# Patient Record
Sex: Female | Born: 1965 | Race: White | Hispanic: No | Marital: Married
Health system: Southern US, Community
[De-identification: ages and names within clinical notes are randomized; demographics above are authoritative.]

---

## 2016-12-24 ENCOUNTER — Emergency Department (HOSPITAL_COMMUNITY): Payer: BLUE CROSS/BLUE SHIELD

## 2016-12-24 ENCOUNTER — Emergency Department (HOSPITAL_COMMUNITY)
Admission: EM | Admit: 2016-12-24 | Discharge: 2016-12-24 | Disposition: A | Discharge status: Home / Self Care | Payer: BLUE CROSS/BLUE SHIELD | Attending: Emergency Medicine | Admitting: Emergency Medicine

## 2016-12-24 DIAGNOSIS — S8991XA Unspecified injury of right lower leg, initial encounter: Secondary | ICD-10-CM | POA: Diagnosis present

## 2016-12-24 DIAGNOSIS — Y929 Unspecified place or not applicable: Secondary | ICD-10-CM | POA: Diagnosis not present

## 2016-12-24 DIAGNOSIS — W010XXA Fall on same level from slipping, tripping and stumbling without subsequent striking against object, initial encounter: Secondary | ICD-10-CM | POA: Insufficient documentation

## 2016-12-24 DIAGNOSIS — Y999 Unspecified external cause status: Secondary | ICD-10-CM | POA: Diagnosis not present

## 2016-12-24 DIAGNOSIS — S8001XA Contusion of right knee, initial encounter: Secondary | ICD-10-CM | POA: Insufficient documentation

## 2016-12-24 DIAGNOSIS — Y9301 Activity, walking, marching and hiking: Secondary | ICD-10-CM | POA: Insufficient documentation

## 2016-12-24 DIAGNOSIS — W19XXXA Unspecified fall, initial encounter: Secondary | ICD-10-CM

## 2016-12-24 MED ORDER — IBUPROFEN 400 MG PO TABS
600.0000 mg | ORAL_TABLET | Freq: Once | ORAL | Status: AC
  Administered 2016-12-24: 12:00:00 600 mg via ORAL
  Filled 2016-12-24: qty 1

## 2016-12-24 NOTE — ED Provider Notes (Signed)
[facility]-EMERGENCY DEPT Provider Note   CSN: [id number] Arrival date & time: 12/24/16  1011  By signing my name below, I, Freida Busmaniana Omoyeni, attest that this documentation has been prepared under the direction and in the presence of Melburn HakeNicole Dezirea Mccollister, [place]-C. Electronically Signed: Freida Busmaniana Omoyeni, Scribe. 12/24/2016. 11:58 AM.  History   Chief Complaint Chief Complaint  Patient presents with  . Fall  . Hip Pain  . Knee Pain    The history is provided by the patient. No language interpreter was used.     HPI Comments:  Mikayla Dennis is a 51 y.o. obese female who presents to the Emergency Department s/p fall this AM complaining of moderate right knee and left shoulder pain following the incident. Pt slipped on wet ground, her right knee came off of the ground first and she landed on the left side.  She denies head injury and LOC. Pt notes mild associated soreness to the left lateral abdomen and lower back. No h/o knee or hip replacements. No alleviating factors noted. She takes ASA daily but no other anti-coagulants. Denies fever, headache, chest pain, abdominal pain, numbness, tingling, weakness, urinary or bowel incontinence. Denies taking any meds PTA.  No past medical history on file.  There are no active problems to display for this patient.   No past surgical history on file.  OB History    No data available       Home Medications    Prior to Admission medications   Not on File    Family History No family history on file.  Social History Social History  Substance Use Topics  . Smoking status: Not on file  . Smokeless tobacco: Not on file  . Alcohol use Not on file     Allergies   Patient has no known allergies.   Review of Systems Review of Systems  Gastrointestinal: Positive for abdominal pain.  Musculoskeletal: Positive for arthralgias, back pain and myalgias.  Neurological: Negative for syncope and weakness.     Physical Exam Updated Vital Signs BP  124/66   Pulse 83   Temp 97.6 F (36.4 C)   Resp 16   SpO2 95%   Physical Exam  Constitutional: She is oriented to person, place, and time. She appears well-developed and well-nourished.  HENT:  Head: Normocephalic and atraumatic. Head is without raccoon's eyes, without Battle's sign, without abrasion, without contusion and without laceration.  Eyes: Conjunctivae and EOM are normal. Right eye exhibits no discharge. Left eye exhibits no discharge. No scleral icterus.  Neck: Normal range of motion. Neck supple.  Cardiovascular: Normal rate, regular rhythm, normal heart sounds and intact distal pulses.   Pulmonary/Chest: Effort normal and breath sounds normal. No respiratory distress. She has no wheezes. She has no rales. She exhibits no tenderness.  No tenderness or deformity palpated over BL clavicle or AC joint   Abdominal: Soft. Bowel sounds are normal. She exhibits no distension and no mass. There is no tenderness. There is no rebound and no guarding.  Musculoskeletal: She exhibits tenderness. She exhibits no edema.       Left shoulder: She exhibits normal range of motion, no tenderness, no bony tenderness, no swelling, no effusion, no crepitus, no deformity, no laceration, no pain, no spasm, normal pulse and normal strength.  No midline C, T, or L tenderness. Full range of motion of neck and back. Full range of motion of bilateral upper and lower extremities, with 5/5 strength. Sensation intact. 2+ radial and PT pulses. Cap  refill <2 seconds.   Tenderness over rigtht anterior knee with FROM and 5/5 strength No swelling, ecchymosis, abrasion, or laceration  FROM of BL hips, no pelvic instability    Neurological: She is alert and oriented to person, place, and time. She has normal strength. No sensory deficit. Gait normal.  Skin: Skin is warm and dry. Capillary refill takes less than 2 seconds.  Nursing note and vitals reviewed.    ED Treatments / Results  DIAGNOSTIC  STUDIES:  Oxygen Saturation is 95% on RA, adequate by my interpretation.    COORDINATION OF CARE:  11:34 AM Discussed treatment plan with pt at bedside and pt agreed to plan.  Labs (all labs ordered are listed, but only abnormal results are displayed) Labs Reviewed - No data to display  EKG  EKG Interpretation None       Radiology Dg Knee Complete 4 Views Right  Result Date: 12/24/2016 CLINICAL DATA:  Right knee pain after fall EXAM: RIGHT KNEE - COMPLETE 4+ VIEW COMPARISON:  None. FINDINGS: Prepatellar soft tissue swelling. No fracture, joint effusion, dislocation or suspicious focal osseous lesion. Minimal medial and patellofemoral compartment osteoarthritis. No radiopaque foreign body. IMPRESSION: 1. Prepatellar right knee soft tissue swelling, with no fracture, joint effusion or malalignment. 2. Minimal medial and patellofemoral compartment right knee osteoarthritis. Electronically Signed   By: Delbert Phenix M.D.   On: 12/24/2016 11:30   Dg Hip Unilat  With Pelvis 2-3 Views Right  Result Date: 12/24/2016 CLINICAL DATA:  Fall this morning.  Right hip pain. EXAM: DG HIP (WITH OR WITHOUT PELVIS) 2-3V RIGHT COMPARISON:  None. FINDINGS: The mineralization and alignment are normal. There is no evidence of acute fracture or dislocation. No evidence of femoral head avascular necrosis. The hip joint spaces are maintained. There are degenerative changes at the sacroiliac joints bilaterally. Postsurgical changes from previous pelvic hernia repair noted. IMPRESSION: No acute findings. Electronically Signed   By: Carey Bullocks M.D.   On: 12/24/2016 11:31    Procedures Procedures (including critical care time)  Medications Ordered in ED Medications  ibuprofen (ADVIL,MOTRIN) tablet 600 mg (not administered)     Initial Impression / Assessment and Plan / ED Course  I have reviewed the triage vital signs and the nursing notes.  Pertinent labs & imaging results that were available during  my care of the patient were reviewed by me and considered in my medical decision making (see chart for details).     Patient X-Ray negative for obvious fracture or dislocation.  Pt advised to follow up with PCP as needed. Patient given ibuprofen while in ED, conservative therapy recommended and discussed. Patient will be discharged home & is agreeable with above plan. Returns precautions discussed. Pt appears safe for discharge.   Final Clinical Impressions(s) / ED Diagnoses   Final diagnoses:  Fall, initial encounter  Contusion of right knee, initial encounter    New Prescriptions New Prescriptions   No medications on file   I personally performed the services described in this documentation, which was scribed in my presence. The recorded information has been reviewed and is accurate.     Barrett Henle, PA-C 12/24/16 1202    Melene Plan, DO 12/24/16 1626

## 2016-12-24 NOTE — Discharge Instructions (Signed)
I recommend taking Tylenol and ibuprofen as prescribed over-the-counter, alternating between doses every 3-4 hours. I also recommend resting, elevating and applying ice to affected area for 15-20 minutes 3-4 times daily. Refrain from doing any heavy lifting or strenuous activity for the next few days and her symptoms have improved. Follow-up with a primary care provider within the next week if your symptoms have not resolved. Return to the emergency department if symptoms worsen or new onset of headache, dizziness, neck pain/stiffness, back pain, chest pain, difficulty breathing, abdominal pain, numbness, tingling, weakness.

## 2016-12-24 NOTE — ED Triage Notes (Signed)
Pt states she slipped in the hallway and fell, Pt states she fell to the side of her R knee and R hip. Pt able to walk with pain. Pt in NAD. Denies hitting head.

## 2016-12-24 NOTE — ED Notes (Signed)
Declined W/C at D/C and was escorted to lobby by RN. 

## 2018-06-10 IMAGING — DX DG KNEE COMPLETE 4+V*R*
4 series · 4 of 4 positions shown · non-contrast
Comparison: None.

CLINICAL DATA: Right knee pain after fall

EXAM:
RIGHT KNEE - COMPLETE 4+ VIEW

[knee ap]
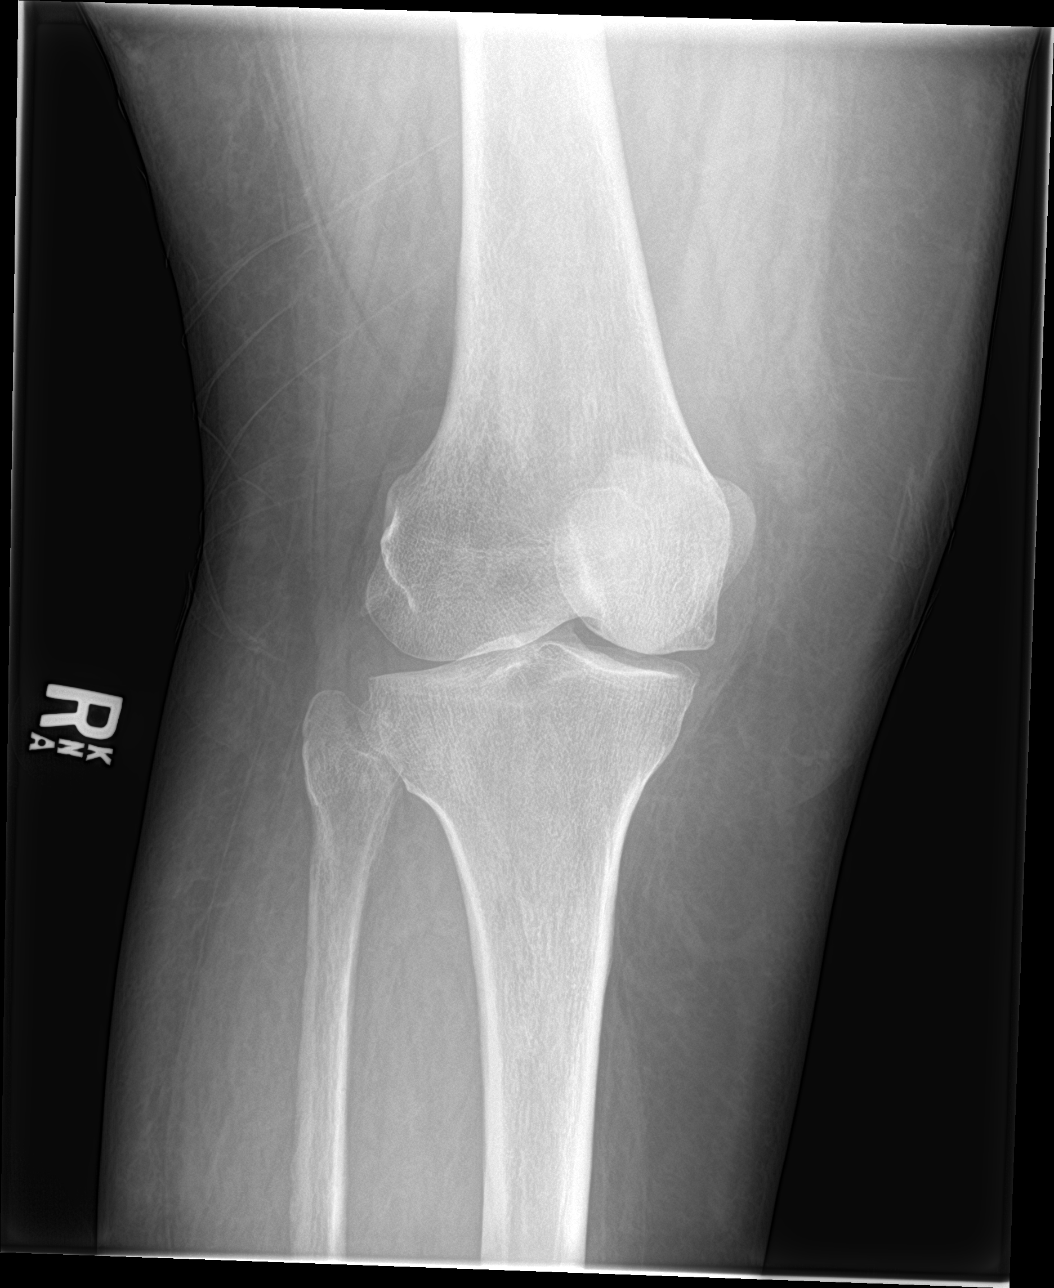

[knee lat]
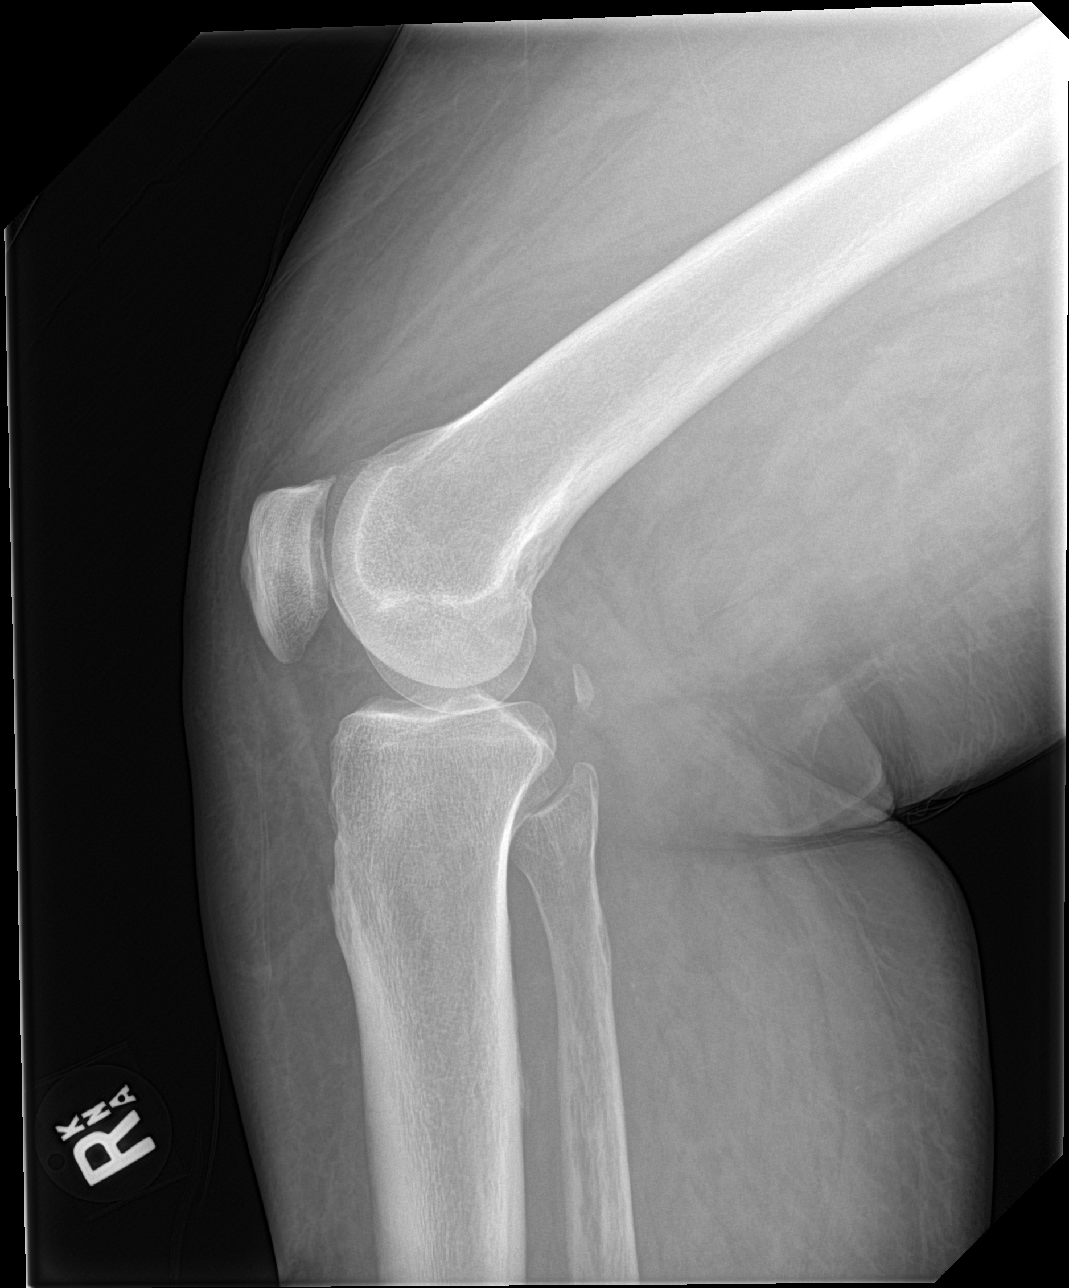

[knee obl (1 of 2)]
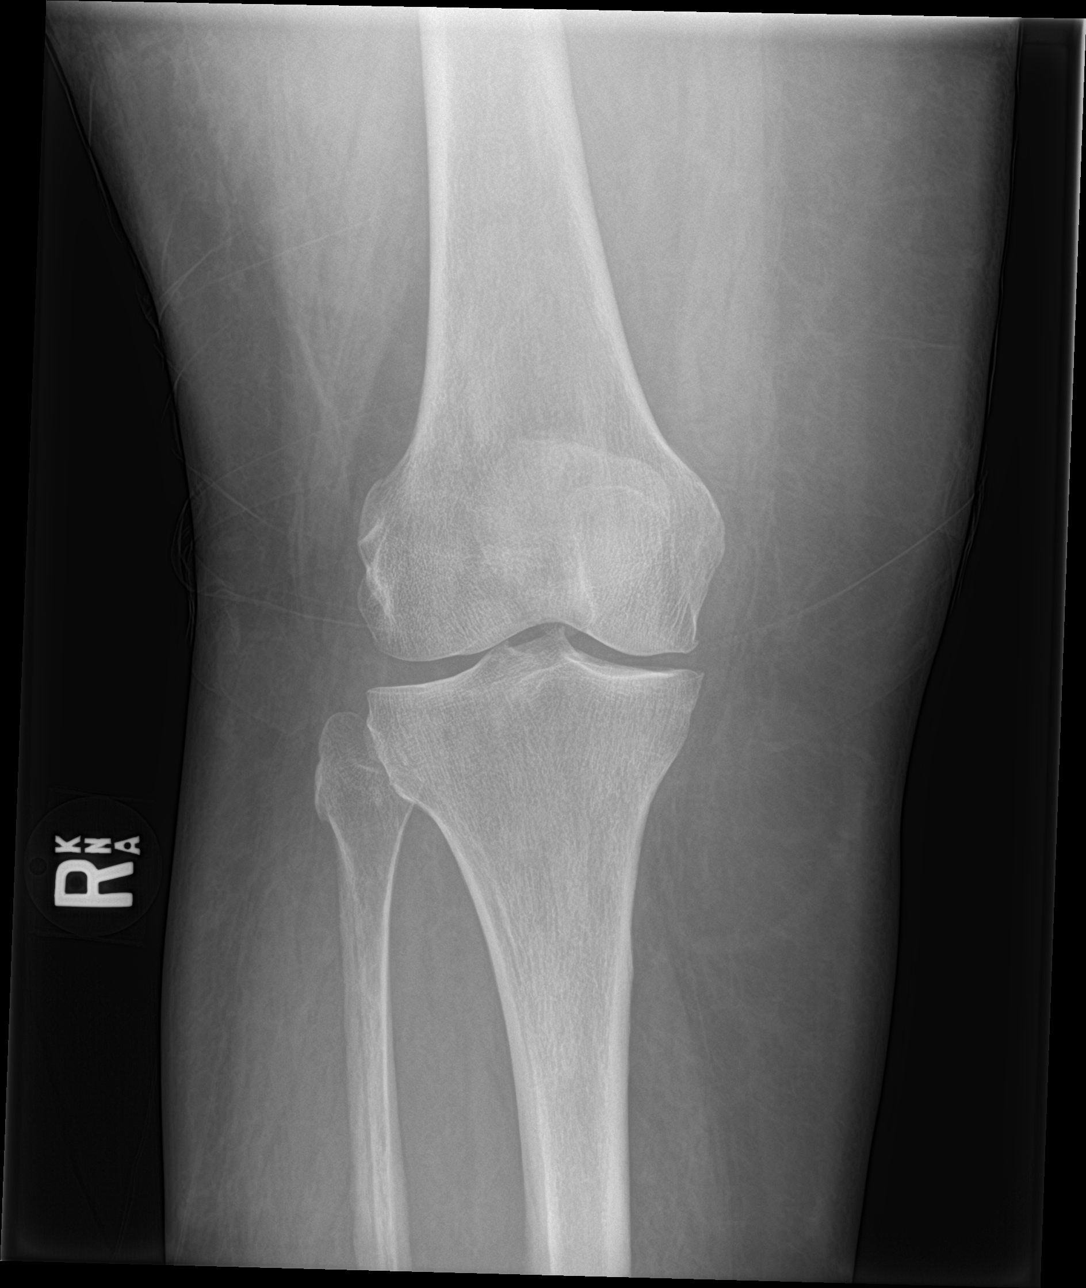

[knee obl (2 of 2)]
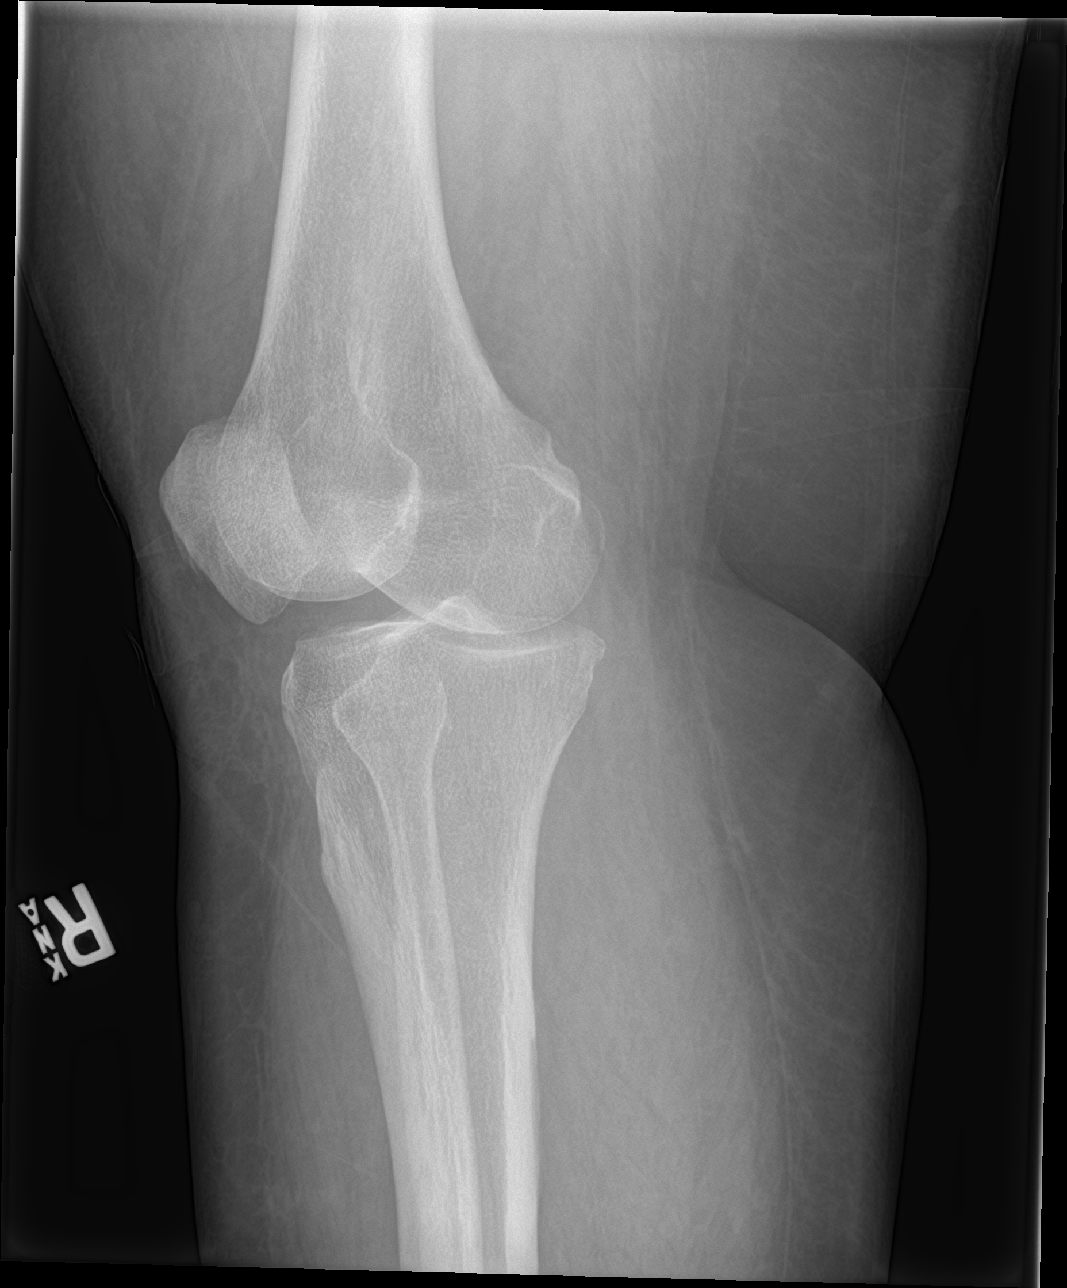

[4 of 4 positions shown; findings below may reference images not displayed]

FINDINGS: Prepatellar soft tissue swelling. No fracture, joint effusion,
dislocation or suspicious focal osseous lesion. Minimal medial and
patellofemoral compartment osteoarthritis. No radiopaque foreign
body.
IMPRESSION: 1. Prepatellar right knee soft tissue swelling, with no fracture,
joint effusion or malalignment.
2. Minimal medial and patellofemoral compartment right knee
osteoarthritis.

## 2018-06-10 IMAGING — DX DG HIP (WITH OR WITHOUT PELVIS) 2-3V*R*
3 series · 3 of 3 positions shown · non-contrast
Comparison: None.

CLINICAL DATA: Fall this morning.  Right hip pain.

EXAM:
DG HIP (WITH OR WITHOUT PELVIS) 2-3V RIGHT

[pelvis ap]
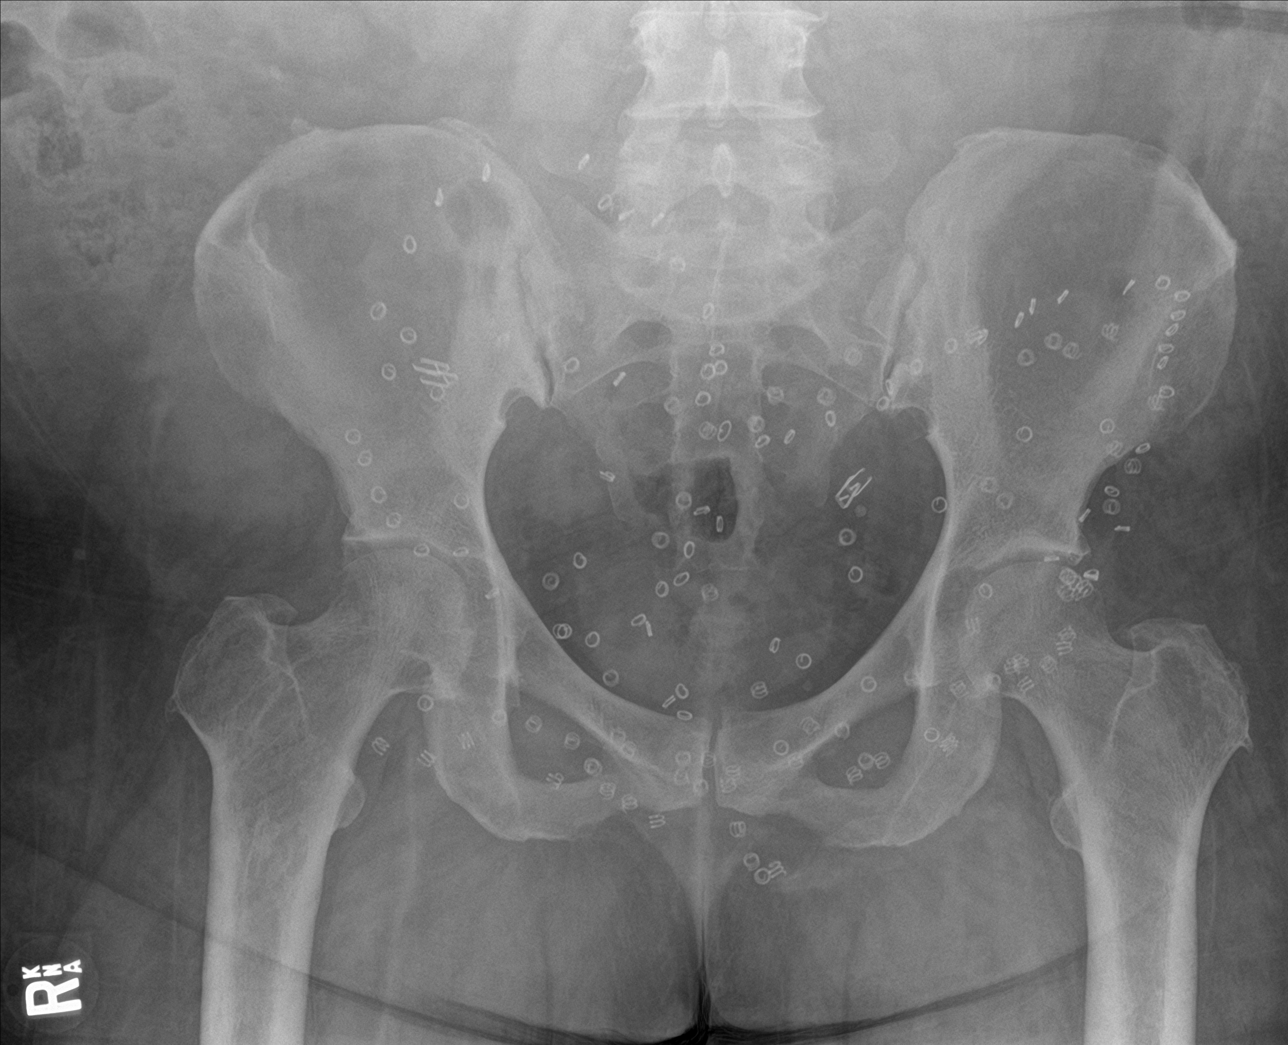

[hip ap]
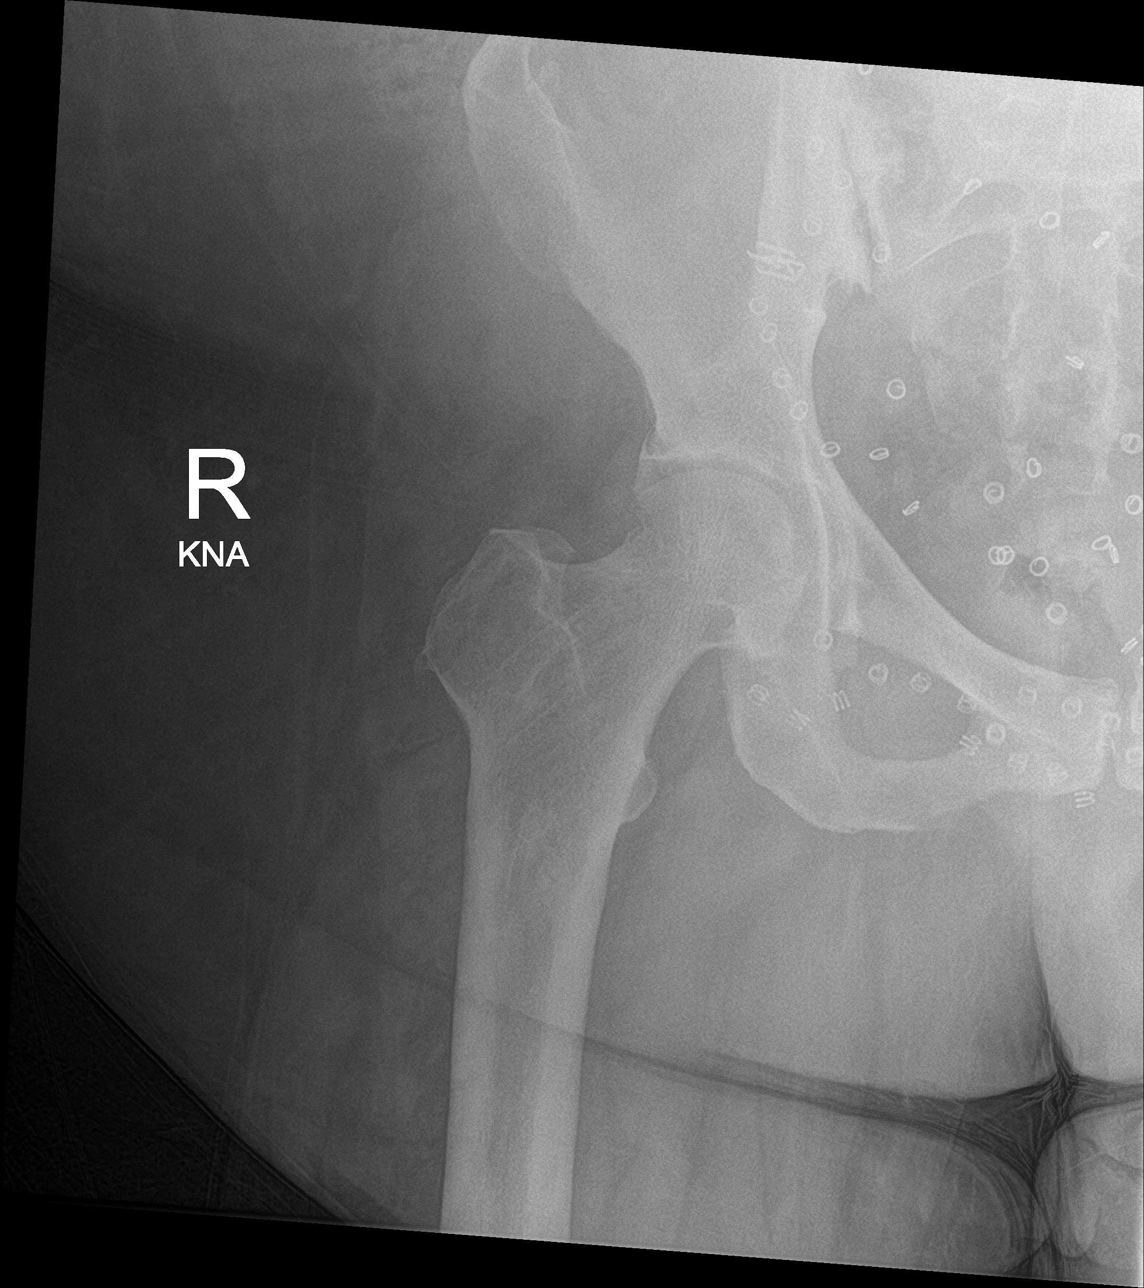

[hip lat]
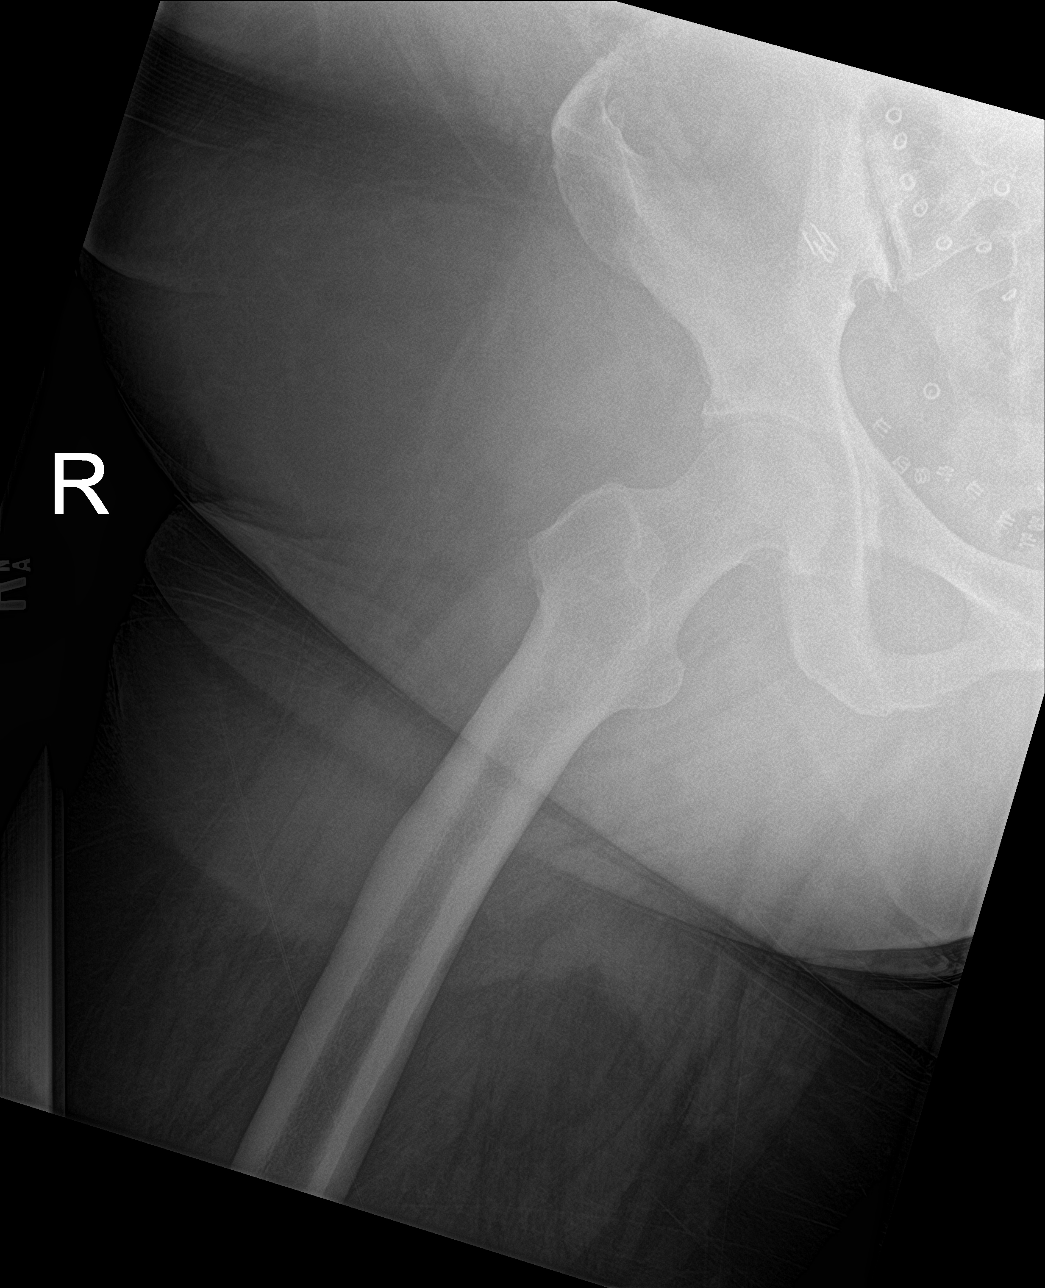

[3 of 3 positions shown; findings below may reference images not displayed]

FINDINGS: The mineralization and alignment are normal. There is no evidence of
acute fracture or dislocation. No evidence of femoral head avascular
necrosis. The hip joint spaces are maintained. There are
degenerative changes at the sacroiliac joints bilaterally.
Postsurgical changes from previous pelvic hernia repair noted.
IMPRESSION: No acute findings.
